# Patient Record
Sex: Female | Born: 2000 | Race: White | Hispanic: No | Marital: Single | Smoking: Never smoker
Health system: Southern US, Community
[De-identification: ages and names within clinical notes are randomized; demographics above are authoritative.]

## PROBLEM LIST (undated history)

## (undated) DIAGNOSIS — L509 Urticaria, unspecified: Secondary | ICD-10-CM

## (undated) DIAGNOSIS — L309 Dermatitis, unspecified: Secondary | ICD-10-CM

## (undated) DIAGNOSIS — J302 Other seasonal allergic rhinitis: Secondary | ICD-10-CM

## (undated) HISTORY — DX: Urticaria, unspecified: L50.9

---
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
## 2022-05-18 ENCOUNTER — Other Ambulatory Visit: Payer: Self-pay | Admitting: *Deleted

## 2022-05-18 MED ORDER — XOLAIR 150 MG/ML ~~LOC~~ SOSY: 375.0000 mg | PREFILLED_SYRINGE | SUBCUTANEOUS | 11 refills | Status: DC

## 2022-06-01 ENCOUNTER — Other Ambulatory Visit: Payer: Self-pay | Admitting: *Deleted

## 2022-06-01 MED ORDER — OMALIZUMAB 75 MG/0.5ML ~~LOC~~ SOSY: 375.0000 mg | PREFILLED_SYRINGE | SUBCUTANEOUS | 11 refills | Status: DC

## 2022-07-25 ENCOUNTER — Other Ambulatory Visit: Payer: Self-pay | Admitting: Allergy & Immunology

## 2022-11-29 ENCOUNTER — Other Ambulatory Visit: Payer: Self-pay | Admitting: Allergy & Immunology

## 2022-12-19 NOTE — Patient Instructions (Incomplete)
1. Moderate persistent asthma, uncomplicated - Daily controller medication(s): Breo one puff once daily + Xolair every other week - Rescue medications: ProAir 4 puffs every 4-6 hours as needed - Asthma control goals:  * Full participation in all desired activities (may need albuterol before activity) * Albuterol use two time or less a week on average (not counting use with activity) * Cough interfering with sleep two time or less a month * Oral steroids no more than once a year * No hospitalizations  2. Allergic rhinoconjunctivitis - Continue with generic Dymista 2 sprays per nostril 1-2 times daily as needed .  - Alternate antihistamines and take an extra if needed.  - You can double up on your antihistamine as well on particularly bad days.   3. Intrinsic atopic dermatitis - Continue with moisturizing twice daily as needed. - Continue triamcinolone ointment to use twice daily as needed. Do not use on face, neck, groin, or armpit region. Do not use longer than 2 weeks in a row  4. Schedule a follow up appointment in month or sooner if needed

## 2022-12-21 ENCOUNTER — Other Ambulatory Visit: Payer: Self-pay

## 2022-12-21 ENCOUNTER — Ambulatory Visit: Payer: BC Managed Care – PPO | Admitting: Family

## 2022-12-21 ENCOUNTER — Encounter: Payer: Self-pay | Admitting: Family

## 2022-12-21 VITALS — BP 110/60 | HR 78 | Temp 99.0°F | Resp 16 | Ht 65.5 in | Wt 124.2 lb

## 2022-12-21 DIAGNOSIS — J3089 Other allergic rhinitis: Secondary | ICD-10-CM

## 2022-12-21 DIAGNOSIS — L2084 Intrinsic (allergic) eczema: Secondary | ICD-10-CM | POA: Diagnosis not present

## 2022-12-21 DIAGNOSIS — J4540 Moderate persistent asthma, uncomplicated: Secondary | ICD-10-CM | POA: Diagnosis not present

## 2022-12-21 DIAGNOSIS — J302 Other seasonal allergic rhinitis: Secondary | ICD-10-CM | POA: Diagnosis not present

## 2022-12-21 MED ORDER — EPINEPHRINE 0.3 MG/0.3ML IJ SOAJ: 0.3000 mg | INTRAMUSCULAR | 1 refills | Status: AC | PRN

## 2022-12-21 MED ORDER — ALBUTEROL SULFATE HFA 108 (90 BASE) MCG/ACT IN AERS: 2.0000 | INHALATION_SPRAY | RESPIRATORY_TRACT | 1 refills | Status: AC | PRN

## 2022-12-21 MED ORDER — FLUTICASONE FUROATE-VILANTEROL 100-25 MCG/ACT IN AEPB: INHALATION_SPRAY | RESPIRATORY_TRACT | 2 refills | Status: AC

## 2022-12-21 MED ORDER — AZELASTINE-FLUTICASONE 137-50 MCG/ACT NA SUSP: NASAL | 5 refills | Status: AC

## 2022-12-21 NOTE — Progress Notes (Signed)
[address] [place] [address] Dept: [phone]  FOLLOW UP NOTE  Patient ID: Katelyn Morgan, female    DOB: 21-Aug-2000  Age: 22 y.o. MRN: [id number] Date of Office Visit: 12/21/2022  Assessment  Chief Complaint: Follow-up (Xolair reapproval)  HPI MALISSA Morgan is a 22 year old female who presents today for follow-up of moderate persistent asthma, allergic rhinoconjunctivitis right-sided nasal polyp, and intrinsic atopic dermatitis.  She was last seen on Oct 27, 2021 by Dr. Dellis Anes.  She denies any new diagnosis or surgery since her last office visit.  Moderate persistent asthma: She is currently receiving Xolair injections per protocol and denies any problems or reactions with her Xolair.  She does report that Xolair has helped her asthma.  She stopped taking Breo 100 mcg 1 puff once a day once she felt her breathing was better. She reports that she and Dr. Dellis Anes have discussed this in the past. She reports in the past she has tried stopping Xolair and just using Breo and her asthma was not well-controlled.  She reports for the past couple days she has had an occasional productive cough with clear sputum.  She feels like this cough is due to being late for her Xolair injection.  She denies wheezing, tightness in chest, shortness of breath, and nocturnal awakenings due to breathing problems.  Since her last office visit she has not required any systemic steroids or made any trips to the emergency room or urgent care due to breathing problems.  She cannot remember the last time she used albuterol.  Allergic rhinoconjunctivitis: She reports occasional rhinorrhea and denies nasal congestion and postnasal drip.  She has not had any sinus infections since we last saw her.  She uses generic Dymista as needed.  Intrinsic atopic dermatitis is reported as normally doing well, but after coming back from the beach it has been worse than normal.  She reports dry skin on her shoulder region  and bends of her arms.  She attributes the chlorine in sunscreen to causing her eczema to flare.  He has not had any skin infections since we last saw her.   Drug Allergies:  Allergies  Allergen Reactions   Sulfa Drugs Cross Reactors Rash    Review of Systems: Review of Systems  Constitutional:  Negative for chills and fever.  HENT:         Reports occasional rhinorrhea and denies nasal congestion and postnasal drip  Eyes:        Denies itchy watery eyes  Respiratory:  Positive for cough. Negative for shortness of breath and wheezing.        Reports occasional productive cough with clear sputum the past couple days.  Denies wheezing, tightness in chest, shortness of breath, and nocturnal awakenings due to breathing problems  Cardiovascular:  Negative for chest pain and palpitations.  Gastrointestinal:        Denies heartburn or reflux symptoms  Skin:        Reports dry skin and eczema flare since just coming back from the beach.  Neurological:  Positive for headaches.       Reports usual migraines.  Endo/Heme/Allergies:  Positive for environmental allergies.     Physical Exam: BP 110/60   Pulse 78   Temp 99 F (37.2 C) (Temporal)   Resp 16   Ht 5' 5.5" (1.664 m)   Wt 124 lb 3.2 oz (56.3 kg)   SpO2 98%   BMI 20.35 kg/m    Physical Exam  Constitutional:      Appearance: Normal appearance.  HENT:     Head: Normocephalic and atraumatic.     Comments: Pharynx normal, eyes normal, ears normal, nose: Bilateral lower turbinates mildly edematous with no drainage noted    Right Ear: Tympanic membrane, ear canal and external ear normal.     Left Ear: Tympanic membrane, ear canal and external ear normal.     Mouth/Throat:     Mouth: Mucous membranes are moist.     Pharynx: Oropharynx is clear.  Eyes:     Conjunctiva/sclera: Conjunctivae normal.  Cardiovascular:     Rate and Rhythm: Regular rhythm.  Pulmonary:     Effort: Pulmonary effort is normal.     Breath sounds:  Normal breath sounds.     Comments: Lungs clear to auscultation and Musculoskeletal:     Cervical back: Neck supple.  Skin:    General: Skin is warm.     Comments: Dry skin noted on bilateral antecubital fossa  Neurological:     Mental Status: She is alert and oriented to person, place, and time.  Psychiatric:        Mood and Affect: Mood normal.        Behavior: Behavior normal.        Thought Content: Thought content normal.        Judgment: Judgment normal.     Diagnostics: FVC 3.67 L (92%), FEV1 3.04 L (87%.  Spirometry indicates normal spirometry.  Assessment and Plan: 1. Moderate persistent asthma, uncomplicated   2. Seasonal and perennial allergic rhinitis   3. Intrinsic atopic dermatitis     Meds ordered this encounter  Medications   albuterol (VENTOLIN HFA) 108 (90 Base) MCG/ACT inhaler    Sig: Inhale 2 puffs into the lungs every 4 (four) hours as needed for wheezing or shortness of breath.    Dispense:  18 g    Refill:  1   Azelastine-Fluticasone 137-50 MCG/ACT SUSP    Sig: SHAKE LIQUID AND USE 1 SPRAY IN EACH NOSTRIL TWICE DAILY    Dispense:  23 g    Refill:  5   EPINEPHrine (AUVI-Q) 0.3 mg/0.3 mL IJ SOAJ injection    Sig: Inject 0.3 mg into the muscle as needed for anaphylaxis.    Dispense:  2 each    Refill:  1    [phone]   fluticasone furoate-vilanterol (BREO ELLIPTA) 100-25 MCG/ACT AEPB    Sig: During asthma flares/upper respiratory tract infections take 1 inhalation once a day for 1 to 2 weeks or until symptoms return to baseline.  Rinse mouth out afterwards    Dispense:  28 each    Refill:  2    Patient Instructions  1. Moderate persistent asthma, uncomplicated - Daily controller medication(s):  Xolair every other week. Johny Shock prescription sent to [facility] pharmacy. Dyanne Carrel via phone that she could come by our office for demonstration on how to use Auvi Q device if she would like. Discussed that Johny Shock would be coming from [facility]  pharmacy. -For asthma flares start Breo 100 mcg 1 puff once a day for one to two weeks or until symtpoms return to baseline - Rescue medications: ProAir 4 puffs every 4-6 hours as needed - Asthma control goals:  * Full participation in all desired activities (may need albuterol before activity) * Albuterol use two time or less a week on average (not counting use with activity) * Cough interfering with sleep two time or less a month *  Oral steroids no more than once a year * No hospitalizations  2. Allergic rhinoconjunctivitis - Continue with generic Dymista 1 spray per nostril 1-2 times daily as needed .  - Alternate antihistamines and take an extra if needed.  - You can double up on your antihistamine as well on particularly bad days.   3. Intrinsic atopic dermatitis - Continue with moisturizing twice daily as needed. - Continue triamcinolone ointment to use twice daily as needed. Do not use on face, neck, groin, or armpit region. Do not use longer than 2 weeks in a row  4. Schedule a follow up appointment in 6 months or sooner if needed  Return in about 6 months (around 06/23/2023), or if symptoms worsen or fail to improve.    Thank you for the opportunity to care for this patient.  Please do not hesitate to contact me with questions.  Nehemiah Settle, FNP [facility]

## 2023-04-13 ENCOUNTER — Other Ambulatory Visit: Payer: Self-pay | Admitting: *Deleted

## 2023-04-13 MED ORDER — XOLAIR 150 MG/ML ~~LOC~~ SOSY: 375.0000 mg | PREFILLED_SYRINGE | SUBCUTANEOUS | 11 refills | Status: DC

## 2023-04-13 MED ORDER — OMALIZUMAB 75 MG/0.5ML ~~LOC~~ SOSY: 375.0000 mg | PREFILLED_SYRINGE | SUBCUTANEOUS | 11 refills | Status: DC

## 2023-11-18 ENCOUNTER — Telehealth: Payer: Self-pay | Admitting: Allergy & Immunology

## 2023-11-18 NOTE — Telephone Encounter (Signed)
 Left voicemail to give the office a call back to schedule Xolair reapproval appointment.

## 2023-12-01 ENCOUNTER — Ambulatory Visit (INDEPENDENT_AMBULATORY_CARE_PROVIDER_SITE_OTHER): Payer: Self-pay | Admitting: Allergy & Immunology

## 2023-12-01 ENCOUNTER — Encounter: Payer: Self-pay | Admitting: Allergy & Immunology

## 2023-12-01 ENCOUNTER — Other Ambulatory Visit: Payer: Self-pay

## 2023-12-01 VITALS — BP 100/58 | HR 78 | Temp 98.7°F | Ht 65.0 in | Wt 131.3 lb

## 2023-12-01 DIAGNOSIS — J4540 Moderate persistent asthma, uncomplicated: Secondary | ICD-10-CM | POA: Diagnosis not present

## 2023-12-01 DIAGNOSIS — J3089 Other allergic rhinitis: Secondary | ICD-10-CM | POA: Diagnosis not present

## 2023-12-01 DIAGNOSIS — J302 Other seasonal allergic rhinitis: Secondary | ICD-10-CM

## 2023-12-01 DIAGNOSIS — L2084 Intrinsic (allergic) eczema: Secondary | ICD-10-CM

## 2023-12-01 NOTE — Patient Instructions (Addendum)
 1. Moderate persistent asthma, uncomplicated - Lung testing looks good today. - We are not going to make any changes at all today.  - You have avoided prednisone.  - Daily controller medication(s):  Xolair  every other week - For asthma flares: start Breo 100 mcg 1 puff once a day for one to two weeks or until symtpoms return to baseline - Rescue medications: albuterol  4 puffs every 4-6 hours as needed - Asthma control goals: * Full participation in all desired activities (may need albuterol  before activity) * Albuterol  use two time or less a week on average (not counting use with activity) * Cough interfering with sleep two time or less a month * Oral steroids no more than once a year * No hospitalizations  2. Allergic rhinoconjunctivitis - Continue with generic Dymista  1 spray per nostril 1-2 times daily as needed .  - Alternate antihistamines and take an extra if needed.  - You can double up on your antihistamine as well on particularly bad days.   3. Intrinsic atopic dermatitis - Continue with moisturizing twice daily as needed. - Continue triamcinolone  ointment to use twice daily as needed.  - Do not use on face, neck, groin, or armpit region. Do not use longer than 2 weeks in a row. - I would definitely continued with the Aquaphor gloves and Eucerin healing.  - Consider changing to Dupixent.   4. Return in about 1 year (around 11/30/2024). You can have the follow up appointment with Dr. Idolina Maker or a Nurse Practicioner (our Nurse Practitioners are excellent and always have Physician oversight!).    Please inform us  of any Emergency Department visits, hospitalizations, or changes in symptoms. Call us  before going to the ED for breathing or allergy  symptoms since we might be able to fit you in for a sick visit. Feel free to contact us  anytime with any questions, problems, or concerns.  It was a pleasure to see you again today!  Websites that have reliable patient information: 1.  American Academy of Asthma, Allergy , and Immunology: www.aaaai.org 2. Food Allergy  Research and Education (FARE): foodallergy.org 3. Mothers of Asthmatics: http://www.asthmacommunitynetwork.org 4. [employer] of Allergy , Asthma, and Immunology: www.acaai.org      "Like" us  on Facebook and Instagram for our latest updates!      A healthy democracy works best when [employer] participate! Make sure you are registered to vote! If you have moved or changed any of your contact information, you will need to get this updated before voting! Scan the QR codes below to learn more!

## 2023-12-01 NOTE — Progress Notes (Unsigned)
   FOLLOW UP  Date of Service/Encounter:  12/01/23   Assessment:   Moderate persistent asthma, uncomplicated   Perennial and seasonal allergic rhinitis - with right sided nasal polyp   Intrinsic atopic dermatitis   Majoring in Social research officer, government  Plan/Recommendations:   There are no Patient Instructions on file for this visit.   Subjective:   Katelyn Morgan is a 23 y.o. female presenting today for follow up of No chief complaint on file.   Katelyn Morgan has a history of the following: Patient Active Problem List   Diagnosis Date Noted   Seasonal and perennial allergic rhinitis 03/21/2017   Intrinsic atopic dermatitis 09/19/2012   Moderate persistent asthma, uncomplicated 09/19/2012   Urticaria 06/22/2012   Allergic rhinitis 11/29/2011   Viral warts 06/11/2011   Milia 06/11/2011   Eczema 06/11/2011   Asthma exacerbation 05/31/2011    History obtained from: chart review and {Persons; PED relatives w/patient:19415::patient}.  Discussed the use of AI scribe software for clinical note transcription with the patient and/or guardian, who gave verbal consent to proceed.  Katelyn Morgan is a 23 y.o. female presenting for {Blank single:19197::a food challenge,a drug challenge,skin testing,a sick visit,an evaluation of ***,a follow up visit}.  She was last seen in July 2024.  At that time, she was continued on her Xolair  every 2 weeks.  She has Breo that she adds during flares 100 mcg 1 puff once daily.  For her rhinitis, she continue with generic Dymista  as well as antihistamines.  For her atopic dermatitis, we continued with moisturizing twice daily as needed.  Since last visit,  Asthma/Respiratory Symptom History: ***  Allergic Rhinitis Symptom History: ***  Food Allergy  Symptom History: ***  Skin Symptom History: ***  GERD Symptom History: ***  Infection Symptom History: ***  Otherwise, there have been no changes to her past medical history, surgical  history, family history, or social history.    Review of systems otherwise negative other than that mentioned in the HPI.    Objective:   There were no vitals taken for this visit. There is no height or weight on file to calculate BMI.    Physical Exam   Diagnostic studies:    Spirometry: results normal (FEV1: 2.68/77%, FVC: 3.76/94%, FEV1/FVC: 71%).   Spirometry consistent with normal pattern. {Blank single:19197::Albuterol /Atrovent  nebulizer,Xopenex/Atrovent  nebulizer,Albuterol  nebulizer,Albuterol  four puffs via MDI,Xopenex four puffs via MDI} treatment given in clinic with {Blank single:19197::significant improvement in FEV1 per ATS criteria,significant improvement in FVC per ATS criteria,significant improvement in FEV1 and FVC per ATS criteria,improvement in FEV1, but not significant per ATS criteria,improvement in FVC, but not significant per ATS criteria,improvement in FEV1 and FVC, but not significant per ATS criteria,no improvement}.  Allergy  Studies: {Blank single:19197::none,deferred due to recent antihistamine use,deferred due to insurance stipulations that require a separate visit for testing,labs sent instead, }    {Blank single:19197::Allergy  testing results were read and interpreted by myself, documented by clinical staff., }      Drexel Gentles, MD  [facility]

## 2023-12-06 ENCOUNTER — Encounter: Payer: Self-pay | Admitting: Allergy & Immunology

## 2023-12-06 ENCOUNTER — Telehealth: Payer: Self-pay | Admitting: *Deleted

## 2023-12-06 NOTE — Telephone Encounter (Signed)
 L/m for patient to contact me to see if she wants to go to dosing 300mg  +75mg  syringes instead of using 150mg 

## 2024-02-07 ENCOUNTER — Telehealth: Payer: Self-pay | Admitting: *Deleted

## 2024-02-07 MED ORDER — OMALIZUMAB 75 MG/0.5ML ~~LOC~~ SOSY: 375.0000 mg | PREFILLED_SYRINGE | SUBCUTANEOUS | 3 refills | Status: AC

## 2024-02-07 MED ORDER — XOLAIR 300 MG/2ML ~~LOC~~ SOSY: 375.0000 mg | PREFILLED_SYRINGE | SUBCUTANEOUS | 3 refills | Status: DC

## 2024-02-07 MED ORDER — OMALIZUMAB 75 MG/0.5ML ~~LOC~~ SOSY: 375.0000 mg | PREFILLED_SYRINGE | SUBCUTANEOUS | 3 refills | Status: DC

## 2024-02-07 NOTE — Telephone Encounter (Signed)
 Spoke to mother and advised update scripts with 300mg  syr and 75mg  syr

## 2024-02-07 NOTE — Addendum Note (Signed)
 Addended by: OTHA MADELIN HERO on: 02/07/2024 04:58 PM   Modules accepted: Orders

## 2024-02-09 MED ORDER — XOLAIR 300 MG/2ML ~~LOC~~ SOSY: 375.0000 mg | PREFILLED_SYRINGE | SUBCUTANEOUS | 3 refills | Status: DC

## 2024-02-09 NOTE — Addendum Note (Signed)
 Addended by: OTHA MADELIN HERO on: 02/09/2024 12:16 PM   Modules accepted: Orders

## 2024-02-10 MED ORDER — XOLAIR 300 MG/2ML ~~LOC~~ SOSY: 375.0000 mg | PREFILLED_SYRINGE | SUBCUTANEOUS | 3 refills | Status: AC

## 2024-02-10 NOTE — Addendum Note (Signed)
 Addended by: OTHA MADELIN HERO on: 02/10/2024 09:21 AM   Modules accepted: Orders
